# Patient Record
Sex: Female | Born: 1985 | Race: Black or African American | Hispanic: No | Marital: Married | Smoking: Never smoker
Health system: Southern US, Community
[De-identification: ages and names within clinical notes are randomized; demographics above are authoritative.]

## PROBLEM LIST (undated history)

## (undated) DIAGNOSIS — C50919 Malignant neoplasm of unspecified site of unspecified female breast: Secondary | ICD-10-CM

## (undated) DIAGNOSIS — IMO0002 Reserved for concepts with insufficient information to code with codable children: Secondary | ICD-10-CM

## (undated) DIAGNOSIS — M329 Systemic lupus erythematosus, unspecified: Secondary | ICD-10-CM

---

## 2011-04-18 ENCOUNTER — Ambulatory Visit: Payer: Self-pay | Admitting: Family Medicine

## 2018-02-08 ENCOUNTER — Other Ambulatory Visit: Payer: Self-pay

## 2018-02-08 ENCOUNTER — Ambulatory Visit
Admission: EM | Admit: 2018-02-08 | Discharge: 2018-02-08 | Disposition: A | Discharge status: Home / Self Care | Payer: 59 | Attending: Family Medicine | Admitting: Family Medicine

## 2018-02-08 ENCOUNTER — Encounter: Payer: Self-pay | Admitting: Gynecology

## 2018-02-08 DIAGNOSIS — J069 Acute upper respiratory infection, unspecified: Secondary | ICD-10-CM

## 2018-02-08 MED ORDER — HYDROCOD POLST-CPM POLST ER 10-8 MG/5ML PO SUER: 5.0000 mL | Freq: Two times a day (BID) | ORAL | 0 refills | Status: DC

## 2018-02-08 MED ORDER — BENZONATATE 200 MG PO CAPS: ORAL_CAPSULE | ORAL | 0 refills | Status: DC

## 2018-02-08 MED ORDER — FLUTICASONE PROPIONATE 50 MCG/ACT NA SUSP: 2.0000 | Freq: Every day | NASAL | 0 refills | Status: DC

## 2018-02-08 NOTE — ED Provider Notes (Signed)
[facility]    CSN: [id number] Arrival date & time: 02/08/18  1013     History   Chief Complaint Chief Complaint  Patient presents with  . Cough  . Headache    HPI Deborah Orr is a 32 y.o. female.   HPI  32 year old female accompanied by her children and her husband presents with cough and headaches that she has had for 1 week.  She has had no fever or chills.  Of her family members have the same symptoms.  Her husband had it initially and seemed to give everyone else in the family that the symptoms.  He at the present time is improving.     History reviewed. No pertinent past medical history.  There are no active problems to display for this patient.   History reviewed. No pertinent surgical history.  OB History    No data available       Home Medications    Prior to Admission medications   Medication Sig Start Date End Date Taking? Authorizing Provider  benzonatate (TESSALON) 200 MG capsule Take one cap TID PRN cough 02/08/18   Lorin Picket, PA-C  chlorpheniramine-HYDROcodone [facility]) 10-8 MG/5ML SUER Take 5 mLs by mouth 2 (two) times daily. 02/08/18   Lorin Picket, PA-C  fluticasone (FLONASE) 50 MCG/ACT nasal spray Place 2 sprays into both nostrils daily. 02/08/18   Lorin Picket, PA-C    Family History Family History  Problem Relation Age of Onset  . Hypertension Mother     Social History Social History   Tobacco Use  . Smoking status: Never Smoker  . Smokeless tobacco: Never Used  Substance Use Topics  . Alcohol use: No    Frequency: Never  . Drug use: No     Allergies   Patient has no known allergies.   Review of Systems Review of Systems  Constitutional: Positive for activity change. Negative for chills, fatigue and fever.  HENT: Positive for congestion, postnasal drip and rhinorrhea.   Respiratory: Positive for cough.   All other systems reviewed and are negative.    Physical Exam Triage  Vital Signs ED Triage Vitals  Enc Vitals Group     BP 02/08/18 1110 120/80     Pulse Rate 02/08/18 1110 98     Resp 02/08/18 1110 16     Temp 02/08/18 1110 98.3 F (36.8 C)     Temp Source 02/08/18 1110 Oral     SpO2 02/08/18 1110 100 %     Weight 02/08/18 1111 163 lb (73.9 kg)     Height 02/08/18 1111 5\' 5"  (1.651 m)     Head Circumference --      Peak Flow --      Pain Score 02/08/18 1111 7     Pain Loc --      Pain Edu? --      Excl. in [place]? --    No data found.  Updated Vital Signs BP 120/80 (BP Location: Right Arm)   Pulse 98   Temp 98.3 F (36.8 C) (Oral)   Resp 16   Ht 5\' 5"  (1.651 m)   Wt 163 lb (73.9 kg)   LMP 01/18/2018   SpO2 100%   BMI 27.12 kg/m   Visual Acuity Right Eye Distance:   Left Eye Distance:   Bilateral Distance:    Right Eye Near:   Left Eye Near:    Bilateral Near:     Physical Exam  Constitutional:  She is oriented to person, place, and time. She appears well-developed and well-nourished.  Non-toxic appearance. She does not appear ill. No distress.  HENT:  Head: Normocephalic.  Eyes: Pupils are equal, round, and reactive to light.  Neck: Normal range of motion.  Pulmonary/Chest: Effort normal and breath sounds normal.  Musculoskeletal: Normal range of motion.  Neurological: She is alert and oriented to person, place, and time.  Skin: Skin is warm and dry.  Psychiatric: She has a normal mood and affect. Her behavior is normal.  Nursing note and vitals reviewed.    UC Treatments / Results  Labs (all labs ordered are listed, but only abnormal results are displayed) Labs Reviewed - No data to display  EKG  EKG Interpretation None       Radiology No results found.  Procedures Procedures (including critical care time)  Medications Ordered in UC Medications - No data to display   Initial Impression / Assessment and Plan / UC Course  I have reviewed the triage vital signs and the nursing notes.  Pertinent labs &  imaging results that were available during my care of the patient were reviewed by me and considered in my medical decision making (see chart for details).     Plan: 1. Test/x-ray results and diagnosis reviewed with patient 2. rx as per orders; risks, benefits, potential side effects reviewed with patient 3. Recommend supportive treatment with rest and fluids.  Tylenol or Motrin for fever or body aches.  Told her this is likely a virus does not require antibiotics.  Not improving she should follow-up with her primary care physician 4. F/u prn if symptoms worsen or don't improve   Final Clinical Impressions(s) / UC Diagnoses   Final diagnoses:  Upper respiratory tract infection, unspecified type    ED Discharge Orders        Ordered    benzonatate (TESSALON) 200 MG capsule     02/08/18 1233    chlorpheniramine-HYDROcodone (TUSSIONEX PENNKINETIC ER) 10-8 MG/5ML SUER  2 times daily     02/08/18 1233    fluticasone (FLONASE) 50 MCG/ACT nasal spray  Daily     02/08/18 1233       Controlled Substance Prescriptions [place] Controlled Substance Registry consulted? Not Applicable   Lorin Picket, PA-C 02/08/18 1742

## 2018-02-08 NOTE — ED Triage Notes (Signed)
Per patient cough / headaches x 1 week.

## 2019-06-26 ENCOUNTER — Encounter: Payer: Self-pay | Admitting: Emergency Medicine

## 2019-06-26 ENCOUNTER — Other Ambulatory Visit: Payer: Self-pay

## 2019-06-26 ENCOUNTER — Ambulatory Visit (INDEPENDENT_AMBULATORY_CARE_PROVIDER_SITE_OTHER): Payer: 59

## 2019-06-26 ENCOUNTER — Ambulatory Visit
Admission: EM | Admit: 2019-06-26 | Discharge: 2019-06-26 | Disposition: A | Discharge status: Home / Self Care | Payer: 59 | Attending: Emergency Medicine | Admitting: Emergency Medicine

## 2019-06-26 DIAGNOSIS — M25562 Pain in left knee: Secondary | ICD-10-CM | POA: Diagnosis not present

## 2019-06-26 MED ORDER — MELOXICAM 15 MG PO TABS: 15.0000 mg | ORAL_TABLET | Freq: Every day | ORAL | 0 refills | Status: DC | PRN

## 2019-06-26 NOTE — ED Provider Notes (Signed)
[facility] ____________________________________________  Time seen: Approximately 8:50 AM  I have reviewed the triage vital signs and the nursing notes.   HISTORY  Chief Complaint Knee Pain (left)  HPI Deborah Orr is a 33 y.o. female with a history of lupus presenting for evaluation of left knee pain.  Patient reports left knee pain has been present for the last 1 week and has increased in the last few days prompting her to come in.  Denies any known specific injury or trauma.  States that she first noticed it while doing exercises with her daughter, particularly lunges.  Has been biking recently.  Denies any clicking, giving out sensation.  No pain radiation.  States pain is an aching inside.  No fall or direct injury.  Reports otherwise doing well.  Has not taken any over-the-counter medication for same complaints.  Denies alleviating or aggravating factors otherwise.  No recent cough, congestion or fevers.  Patient's last menstrual period was 06/12/2019 (approximate).Denies pregnancy.   past medical history Lupus  There are no active problems to display for this patient.   History reviewed. No pertinent surgical history.   No current facility-administered medications for this encounter.   Current Outpatient Medications:  .  meloxicam (MOBIC) 15 MG tablet, Take 1 tablet (15 mg total) by mouth daily as needed., Disp: 20 tablet, Rfl: 0  Allergies Patient has no known allergies.  Family History  Problem Relation Age of Onset  . Hypertension Mother     Social History Social History   Tobacco Use  . Smoking status: Never Smoker  . Smokeless tobacco: Never Used  Substance Use Topics  . Alcohol use: No    Frequency: Never  . Drug use: No    Review of Systems Constitutional: No fever ENT: No sore throat. Cardiovascular: Denies chest pain. Respiratory: Denies shortness of breath. Gastrointestinal: No abdominal pain. Musculoskeletal: Positive left  knee pain. Skin: Negative for rash.   ____________________________________________   PHYSICAL EXAM:  VITAL SIGNS: ED Triage Vitals  Enc Vitals Group     BP 06/26/19 0834 (!) 135/93     Pulse Rate 06/26/19 0834 84     Resp 06/26/19 0834 14     Temp 06/26/19 0834 97.6 F (36.4 C)     Temp Source 06/26/19 0834 Oral     SpO2 06/26/19 0834 100 %     Weight 06/26/19 0833 170 lb (77.1 kg)     Height 06/26/19 0833 5\' 6"  (1.676 m)     Head Circumference --      Peak Flow --      Pain Score 06/26/19 0833 4     Pain Loc --      Pain Edu? --      Excl. in [place]? --     Constitutional: Alert and oriented. Well appearing and in no acute distress. Eyes: Conjunctivae are normal. ENT      Head: Normocephalic and atraumatic. Cardiovascular: Normal rate, regular rhythm. Grossly normal heart sounds.  Good peripheral circulation. Respiratory: Normal respiratory effort without tachypnea nor retractions. Breath sounds are clear and equal bilaterally. No wheezes, rales, rhonchi. Musculoskeletal:  Bilateral pedal pulses equal and easily palpated.  Steady gait.   Left knee nontender to direct palpation, mild pain with anterior and posterior drawer test, no medial or lateral stress pain, able to fully extend as well as flex, mild pain with full flexion, left lower extremity otherwise nontender.  No edema noted bilaterally. Neurologic:  Normal speech and language.Speech is normal.  No gait instability.  Skin:  Skin is warm, dry and intact. No rash noted. Psychiatric: Mood and affect are normal. Speech and behavior are normal. Patient exhibits appropriate insight and judgment   ___________________________________________   LABS (all labs ordered are listed, but only abnormal results are displayed)  Labs Reviewed - No data to display ____________________________________________  RADIOLOGY  Dg Knee Complete 4 Views Left  Result Date: 06/26/2019 CLINICAL DATA:  Left knee pain. EXAM: LEFT KNEE -  COMPLETE 4+ VIEW COMPARISON:  None. FINDINGS: No evidence of fracture, dislocation, or joint effusion. No evidence of arthropathy or other focal bone abnormality. Soft tissues are unremarkable. IMPRESSION: Negative. Electronically Signed   By: Aletta Edouard M.D.   On: 06/26/2019 09:39   ____________________________________________   PROCEDURES Procedures    INITIAL IMPRESSION / ASSESSMENT AND PLAN / ED COURSE  Pertinent labs & imaging results that were available during my care of the patient were reviewed by me and considered in my medical decision making (see chart for details).  Well-appearing patient.  No acute distress.  Left knee pain for last 1 week.  Suspect sprain injury.  Left knee x-ray negative.  Will treat with oral daily Mobic.  Encourage rest, gradual increase of activity as tolerated. Discussed indication, risks and benefits of medications with patient.  Discussed follow up with Primary care physician this week as needed. Discussed follow up and return parameters including no resolution or any worsening concerns. Patient verbalized understanding and agreed to plan.   ____________________________________________   FINAL CLINICAL IMPRESSION(S) / ED DIAGNOSES  Final diagnoses:  Acute pain of left knee     ED Discharge Orders         Ordered    meloxicam (MOBIC) 15 MG tablet  Daily PRN     06/26/19 0929           Note: This dictation was prepared with Dragon dictation along with smaller phrase technology. Any transcriptional errors that result from this process are unintentional.         Marylene Land, NP 06/26/19 1001

## 2019-06-26 NOTE — ED Triage Notes (Signed)
Patient c/o left knee pain that started a week ago.  Patient denies injury or fall.

## 2019-06-26 NOTE — Discharge Instructions (Signed)
Take medication as prescribed. Rest. Gradually increase activity as tolerated.   Follow up with your primary care physician this week as needed. Return to Urgent care for new or worsening concerns.

## 2019-07-29 ENCOUNTER — Ambulatory Visit (INDEPENDENT_AMBULATORY_CARE_PROVIDER_SITE_OTHER): Payer: 59

## 2019-07-29 ENCOUNTER — Encounter: Payer: Self-pay | Admitting: Emergency Medicine

## 2019-07-29 ENCOUNTER — Ambulatory Visit
Admission: EM | Admit: 2019-07-29 | Discharge: 2019-07-29 | Disposition: A | Discharge status: Home / Self Care | Payer: 59 | Attending: Emergency Medicine | Admitting: Emergency Medicine

## 2019-07-29 ENCOUNTER — Other Ambulatory Visit: Payer: Self-pay

## 2019-07-29 DIAGNOSIS — M546 Pain in thoracic spine: Secondary | ICD-10-CM | POA: Diagnosis not present

## 2019-07-29 DIAGNOSIS — R071 Chest pain on breathing: Secondary | ICD-10-CM | POA: Diagnosis not present

## 2019-07-29 HISTORY — DX: Systemic lupus erythematosus, unspecified: M32.9

## 2019-07-29 HISTORY — DX: Reserved for concepts with insufficient information to code with codable children: IMO0002

## 2019-07-29 LAB — BASIC METABOLIC PANEL
Anion gap: 7 (ref 5–15)
BUN: 14 mg/dL (ref 6–20)
CO2: 24 mmol/L (ref 22–32)
Calcium: 9 mg/dL (ref 8.9–10.3)
Chloride: 105 mmol/L (ref 98–111)
Creatinine, Ser: 0.86 mg/dL (ref 0.44–1.00)
GFR calc Af Amer: >60 mL/min (ref >60)
GFR calc non Af Amer: >60 mL/min (ref >60)
Glucose, Bld: 91 mg/dL (ref 70–99)
Potassium: 3.6 mmol/L (ref 3.5–5.1)
Sodium: 136 mmol/L (ref 135–145)

## 2019-07-29 LAB — FIBRIN DERIVATIVES D-DIMER (ARMC ONLY): Fibrin derivatives D-dimer (ARMC): 609.47 ng/mL (FEU) — ABNORMAL HIGH (ref 0.00–499.00)

## 2019-07-29 MED ORDER — IBUPROFEN 600 MG PO TABS: 600.0000 mg | ORAL_TABLET | Freq: Four times a day (QID) | ORAL | 0 refills | Status: DC | PRN

## 2019-07-29 MED ORDER — TIZANIDINE HCL 4 MG PO TABS: 4.0000 mg | ORAL_TABLET | Freq: Three times a day (TID) | ORAL | 0 refills | Status: DC | PRN

## 2019-07-29 MED ORDER — HYDROCODONE-ACETAMINOPHEN 5-325 MG PO TABS: 1.0000 | ORAL_TABLET | Freq: Four times a day (QID) | ORAL | 0 refills | Status: DC | PRN

## 2019-07-29 NOTE — ED Triage Notes (Signed)
Patient c/o sharp pain in her back yesterday while she was sitting in a chair. She states she still has the pain this morning and is unsure if she pulled a muscle.

## 2019-07-29 NOTE — ED Provider Notes (Signed)
HPI  SUBJECTIVE:  Deborah Orr is a 33 y.o. female who presents with the acute onset of sharp, throbbing, constant left thoracic pain while at rest yesterday.  She states that it goes to the top of her back, and went across her upper back this morning.  No change in physical activity, recent heavy lifting.  No fevers, coughing, wheezing, shortness of breath.  No chest pain.  No calf pain, swelling.  No recent surgery, immobilization, hemoptysis.  No numbness or tingling in her hands.  She denies low back pain.  She has had symptoms like this before about a year ago, cannot remember what it was.  Has been in her usual state of health up until today.  She tried ibuprofen last night without improvement in her symptoms.  Symptoms are better with sitting still, lying down, worse with movement, deep inspiration, torso rotation, leaning forward.  Past medical history of lupus.  No history of chronic kidney disease, hypertension, diabetes, PE, DVT, OCP use, CA,  hypercoagulability, pneumothorax, smoking.  Family history negative for DVT/PE. LMP: 3 weeks ago.  Denies the possibility being pregnant.  PMD: Dr. Ancil Boozer at [facility].   Past Medical History:  Diagnosis Date  . Lupus ([place])     History reviewed. No pertinent surgical history.  Family History  Problem Relation Age of Onset  . Hypertension Mother     Social History   Tobacco Use  . Smoking status: Never Smoker  . Smokeless tobacco: Never Used  Substance Use Topics  . Alcohol use: No    Frequency: Never  . Drug use: No    No current facility-administered medications for this encounter.   Current Outpatient Medications:  .  HYDROcodone-acetaminophen (NORCO/VICODIN) 5-325 MG tablet, Take 1-2 tablets by mouth every 6 (six) hours as needed for moderate pain or severe pain., Disp: 12 tablet, Rfl: 0 .  ibuprofen (ADVIL) 600 MG tablet, Take 1 tablet (600 mg total) by mouth every 6 (six) hours as needed., Disp: 30  tablet, Rfl: 0 .  tiZANidine (ZANAFLEX) 4 MG tablet, Take 1 tablet (4 mg total) by mouth every 8 (eight) hours as needed for muscle spasms., Disp: 30 tablet, Rfl: 0  No Known Allergies   ROS  As noted in HPI.   Physical Exam  BP 127/85 (BP Location: Right Arm)   Pulse 72   Temp 98.2 F (36.8 C) (Oral)   Ht 5\' 6"  (1.676 m)   Wt 78 kg   LMP 07/08/2019   SpO2 98%   BMI 27.76 kg/m   Constitutional: Well developed, well nourished, no acute distress Eyes:  EOMI, conjunctiva normal bilaterally HENT: Normocephalic, atraumatic,mucus membranes moist Respiratory: Normal inspiratory effort, lungs clear bilaterally, Cardiovascular: Normal rate regular rhythm, no murmurs rubs or gallops GI: nondistended.  skin: No rash, skin intact Musculoskeletal: No C, T, L-spine tenderness.  Positive trapezial tenderness left side.  Patient states this does not reproduce her pain.  No tenderness over the rhomboid, latissimus, thoracic region where she states the pain is located.  No appreciable muscle spasm.  No tenderness right back.  No paralumbar tenderness.  Pain aggravated with deep inspiration, torso rotation Calves symmetric, nontender, no edema Neurologic: Alert & oriented x 3, no focal neuro deficits.  . Psychiatric: Speech and behavior appropriate   ED Course   Medications - No data to display  Orders Placed This Encounter  Procedures  . DG Chest 2 View    Standing Status:   Standing  Number of Occurrences:   1    Order Specific Question:   Reason for Exam (SYMPTOM  OR DIAGNOSIS REQUIRED)    Answer:   L sided thoracic pain r/o PTX effusion. H/o lupus  . Fibrin derivatives D-Dimer    Standing Status:   Standing    Number of Occurrences:   1  . Basic metabolic panel    Standing Status:   Standing    Number of Occurrences:   1    Results for orders placed or performed during the hospital encounter of 07/29/19 (from the past 24 hour(s))  Fibrin derivatives D-Dimer     Status:  Abnormal   Collection Time: 07/29/19  9:24 AM  Result Value Ref Range   Fibrin derivatives D-dimer ([facility]) 609.47 (H) 0.00 - 499.00 ng/mL (FEU)  Basic metabolic panel     Status: None   Collection Time: 07/29/19  9:24 AM  Result Value Ref Range   Sodium 136 135 - 145 mmol/L   Potassium 3.6 3.5 - 5.1 mmol/L   Chloride 105 98 - 111 mmol/L   CO2 24 22 - 32 mmol/L   Glucose, Bld 91 70 - 99 mg/dL   BUN 14 6 - 20 mg/dL   Creatinine, Ser 0.86 0.44 - 1.00 mg/dL   Calcium 9.0 8.9 - 10.3 mg/dL   GFR calc non Af Amer >60 >60 mL/min   GFR calc Af Amer >60 >60 mL/min   Anion gap 7 5 - 15   Dg Chest 2 View  Result Date: 07/29/2019 CLINICAL DATA:  Pain started across upper left post back yesterday while she was on a phone call. States its hurts to breathe deep or move certain ways. This am pain was going across to post right upper side as well. H/o lupus EXAM: CHEST - 2 VIEW COMPARISON:  None. FINDINGS: The heart size and mediastinal contours are within normal limits. Both lungs are clear. No pneumothorax or pleural effusion. The visualized skeletal structures are unremarkable. IMPRESSION: Normal chest radiograph. Electronically Signed   By: Audie Pinto M.D.   On: 07/29/2019 09:37    ED Clinical Impression  1. Acute left-sided thoracic back pain     ED Assessment/Plan  Review [facility] narcotic database.  No opiate prescriptions since 3/19.  Feel that it is safe to proceed with a controlled substance prescription.  Vitals are normal.  Satting well on room air.  Patient meets PERC criteria. Will get a chest x-ray rule out pneumothorax, pleural effusion.  Doubt pneumonia.  However because lupus is a hypercoagulable state, will check a d-dimer to rule out PE.  Getting BMP for kidney function.  Discussed getting this labs with patient versus treating as if this is musculoskeletal pain.  Discussed with her that if it is elevated she will need to go to the ER for a CT of the chest.  She wishes  to proceed with testing.  Patient declined pain medication.  Reviewed  imaging independently.  No effusion, pneumothorax as read by me.  See radiology report for full details.  We will treat this as if this is musculoskeletal pain for now with ibuprofen and a Tylenol containing product together 3-4 times a day, ibuprofen/1 g of Tylenol for mild to moderate pain, ibuprofen/1-2 Norco for severe pain, Zanaflex.  Will call patient with dimer and BMP Results.  Number [phone].  D-dimer positive. discussed with patient.  Advised her to go to the emergency department to rule out PE. She agrees to go.  discussed imaging, medical decision-making, and plan for follow-up with the patient.  Discussed signs and symptoms that should prompt return to the emergency department.  Patient agrees with plan.  Meds ordered this encounter  Medications  . HYDROcodone-acetaminophen (NORCO/VICODIN) 5-325 MG tablet    Sig: Take 1-2 tablets by mouth every 6 (six) hours as needed for moderate pain or severe pain.    Dispense:  12 tablet    Refill:  0  . ibuprofen (ADVIL) 600 MG tablet    Sig: Take 1 tablet (600 mg total) by mouth every 6 (six) hours as needed.    Dispense:  30 tablet    Refill:  0  . tiZANidine (ZANAFLEX) 4 MG tablet    Sig: Take 1 tablet (4 mg total) by mouth every 8 (eight) hours as needed for muscle spasms.    Dispense:  30 tablet    Refill:  0    *This clinic note was created using [employer]. Therefore, there may be occasional mistakes despite careful proofreading.  ?     Melynda Ripple, MD 07/29/19 1108

## 2019-07-29 NOTE — Discharge Instructions (Addendum)
We are treating this as if this is musculoskeletal pain for now with ibuprofen and a Tylenol containing product together 3-4 times a day, ibuprofen/1 g of Tylenol for mild to moderate pain, ibuprofen/1-2 Norco for severe pain, Zanaflex for muscle spasms.  I will call you with dimer and BMP results.  If you do not hear from me within several hours, feel free to call here.  If your d-dimer is elevated, you will need to go to the emergency department for a CT of the chest to rule out PE.  Go immediately to the ER if you have pain that is not controlled with the ibuprofen/Norco, if you get worse, or for any concerns.  Otherwise, follow-up with your doctor in several days.

## 2020-06-08 ENCOUNTER — Ambulatory Visit: Payer: Self-pay

## 2020-07-21 IMAGING — CR LEFT KNEE - COMPLETE 4+ VIEW
4 series · 4 of 4 positions shown · non-contrast
Comparison: None.

CLINICAL DATA: Left knee pain.

EXAM:
LEFT KNEE - COMPLETE 4+ VIEW

[knee ap]
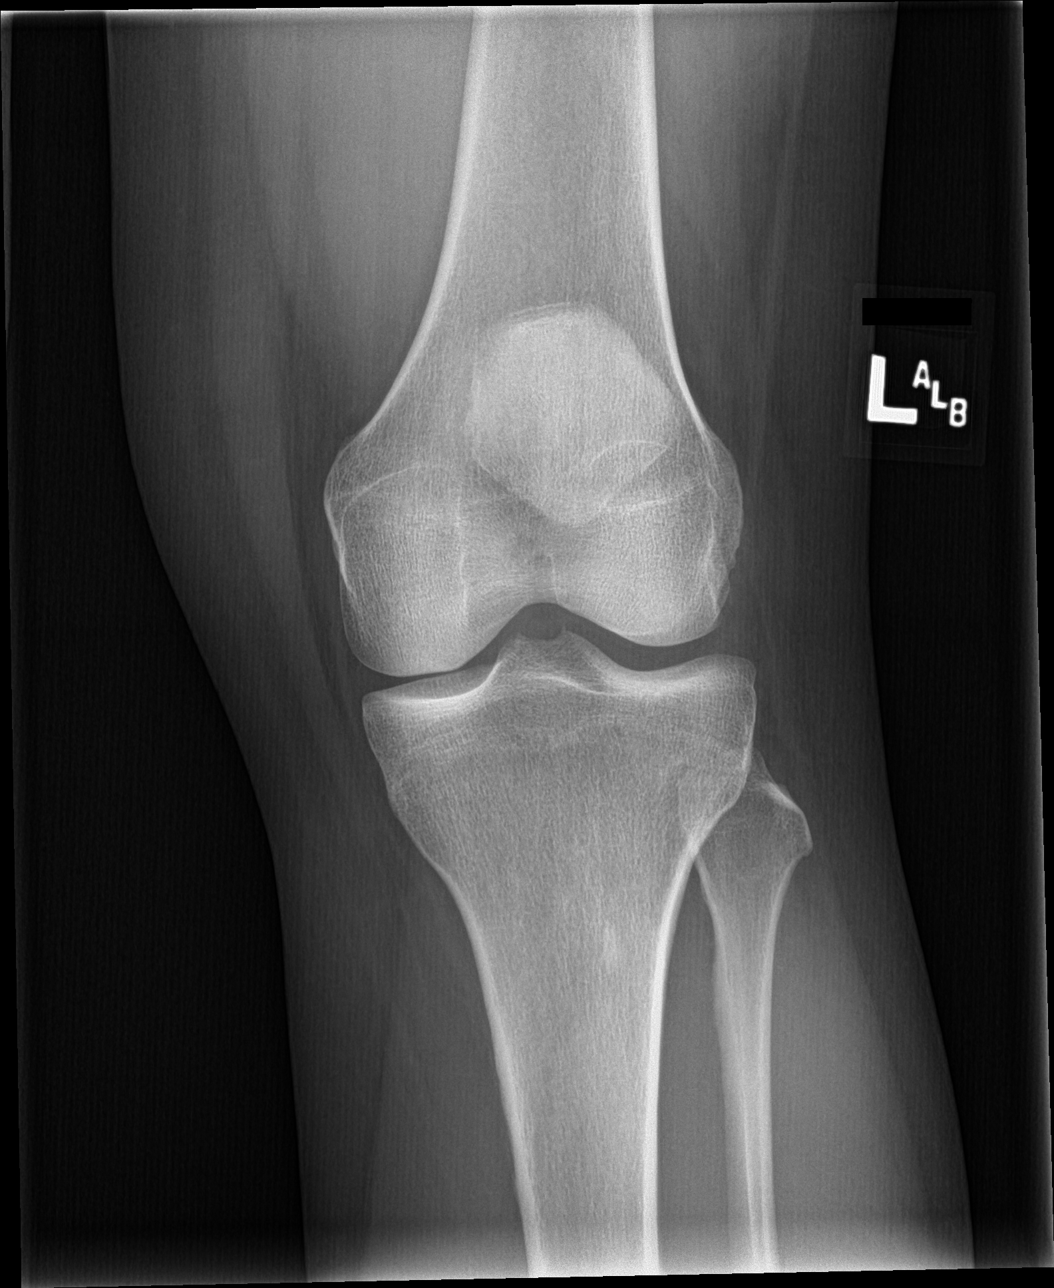

[knee lat]
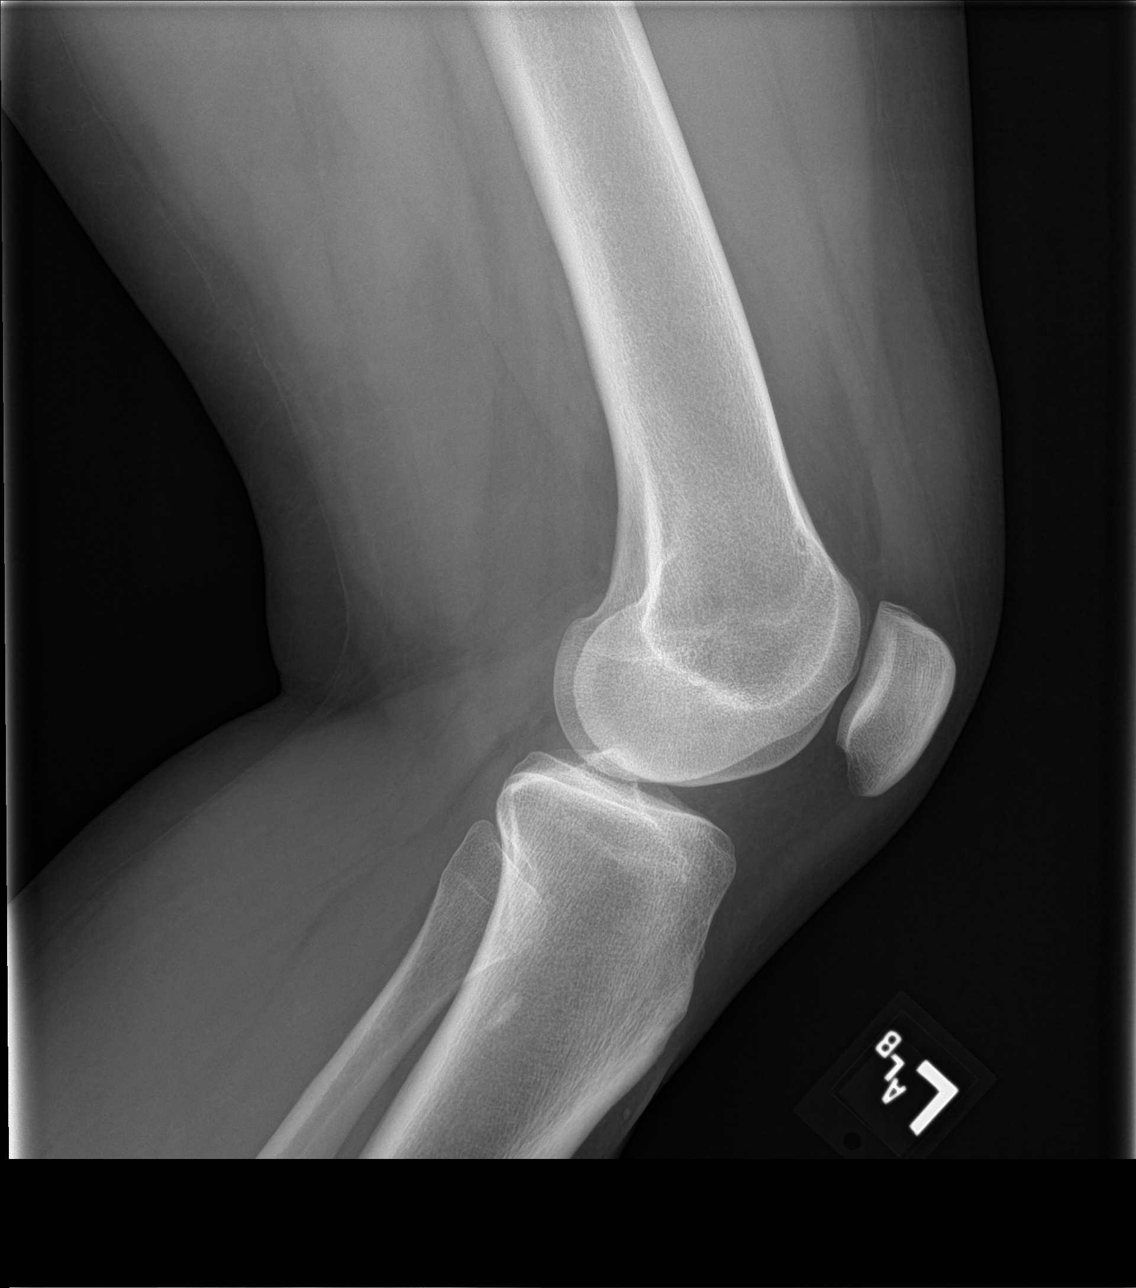

[tunnel]
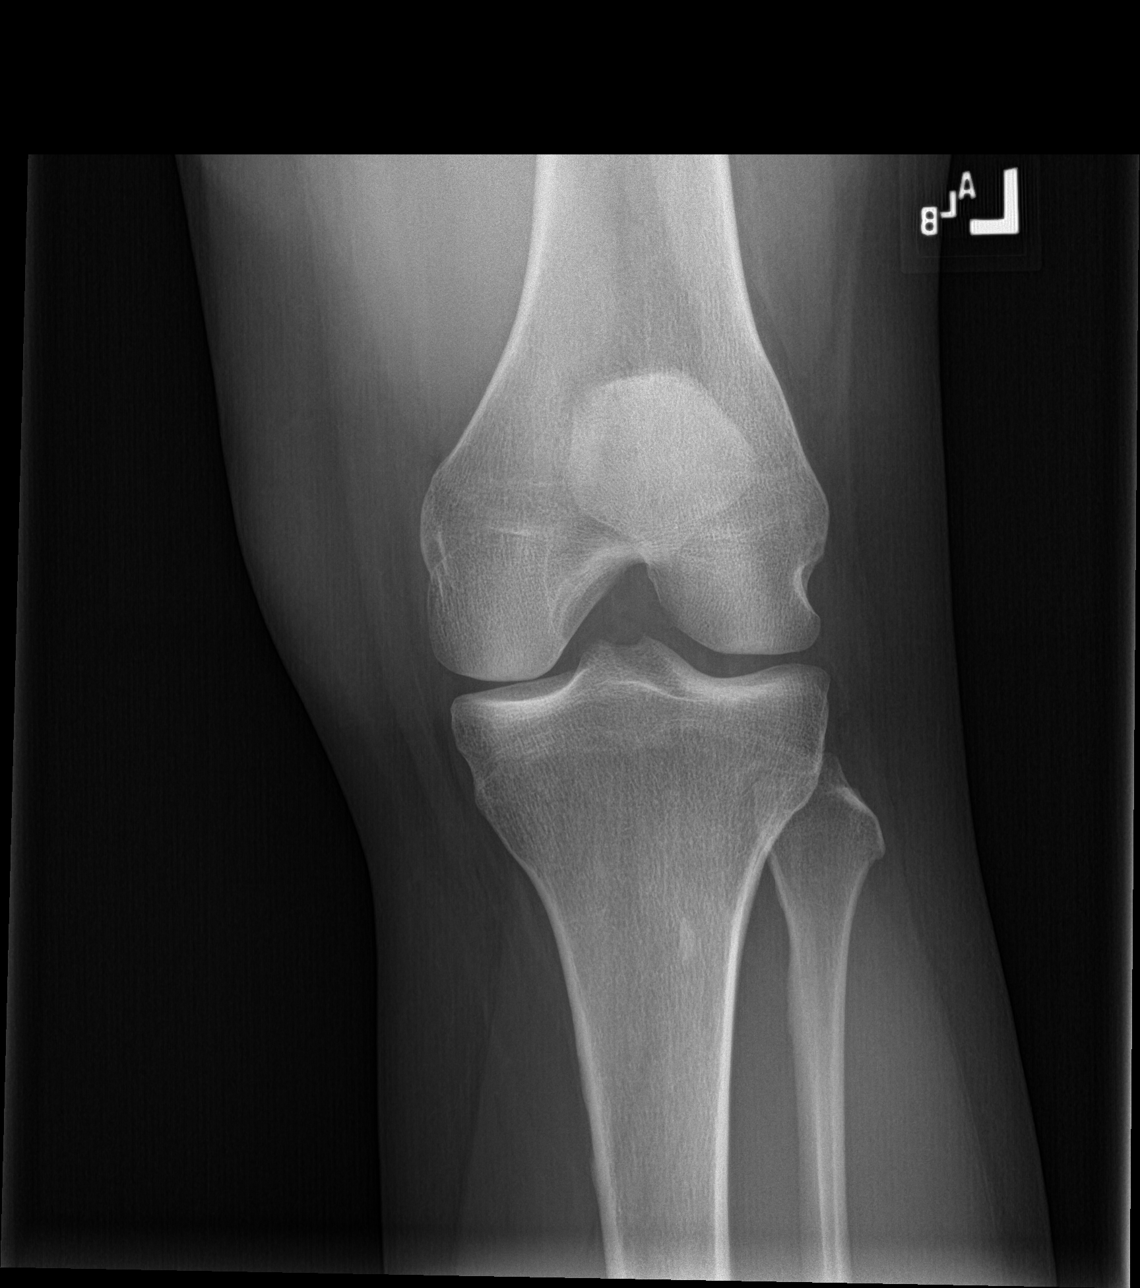

[patella skyline]
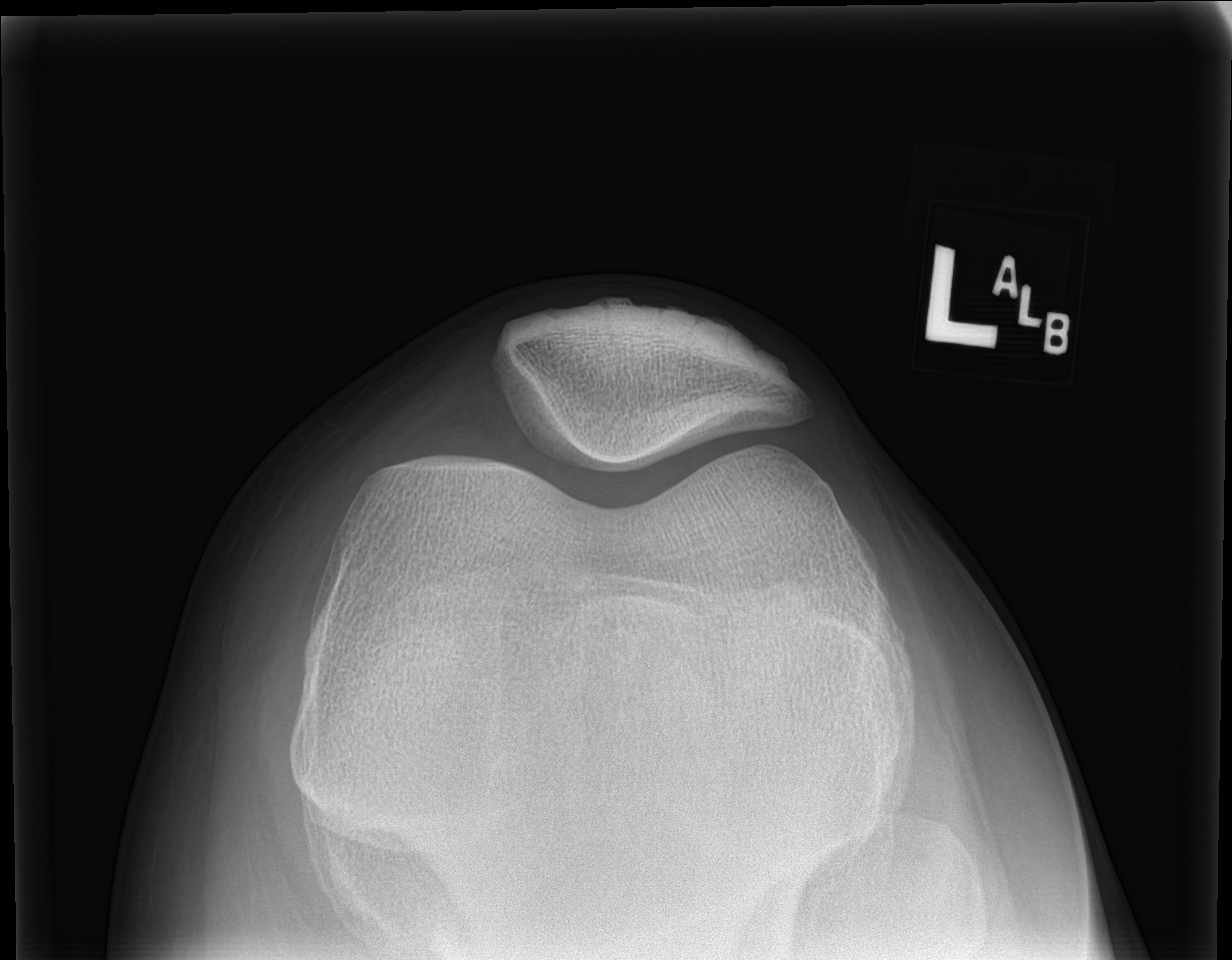

[4 of 4 positions shown; findings below may reference images not displayed]

FINDINGS: No evidence of fracture, dislocation, or joint effusion. No evidence
of arthropathy or other focal bone abnormality. Soft tissues are
unremarkable.
IMPRESSION: Negative.

## 2020-08-23 IMAGING — CR CHEST - 2 VIEW
2 series · 2 of 2 positions shown · non-contrast
Comparison: None.

CLINICAL DATA: Pain started across upper left post back yesterday
while she was on a phone call. States its hurts to breathe deep or
move certain ways. This am pain was going across to post right upper
side as well. H/o lupus

EXAM:
CHEST - 2 VIEW

[chest pa]
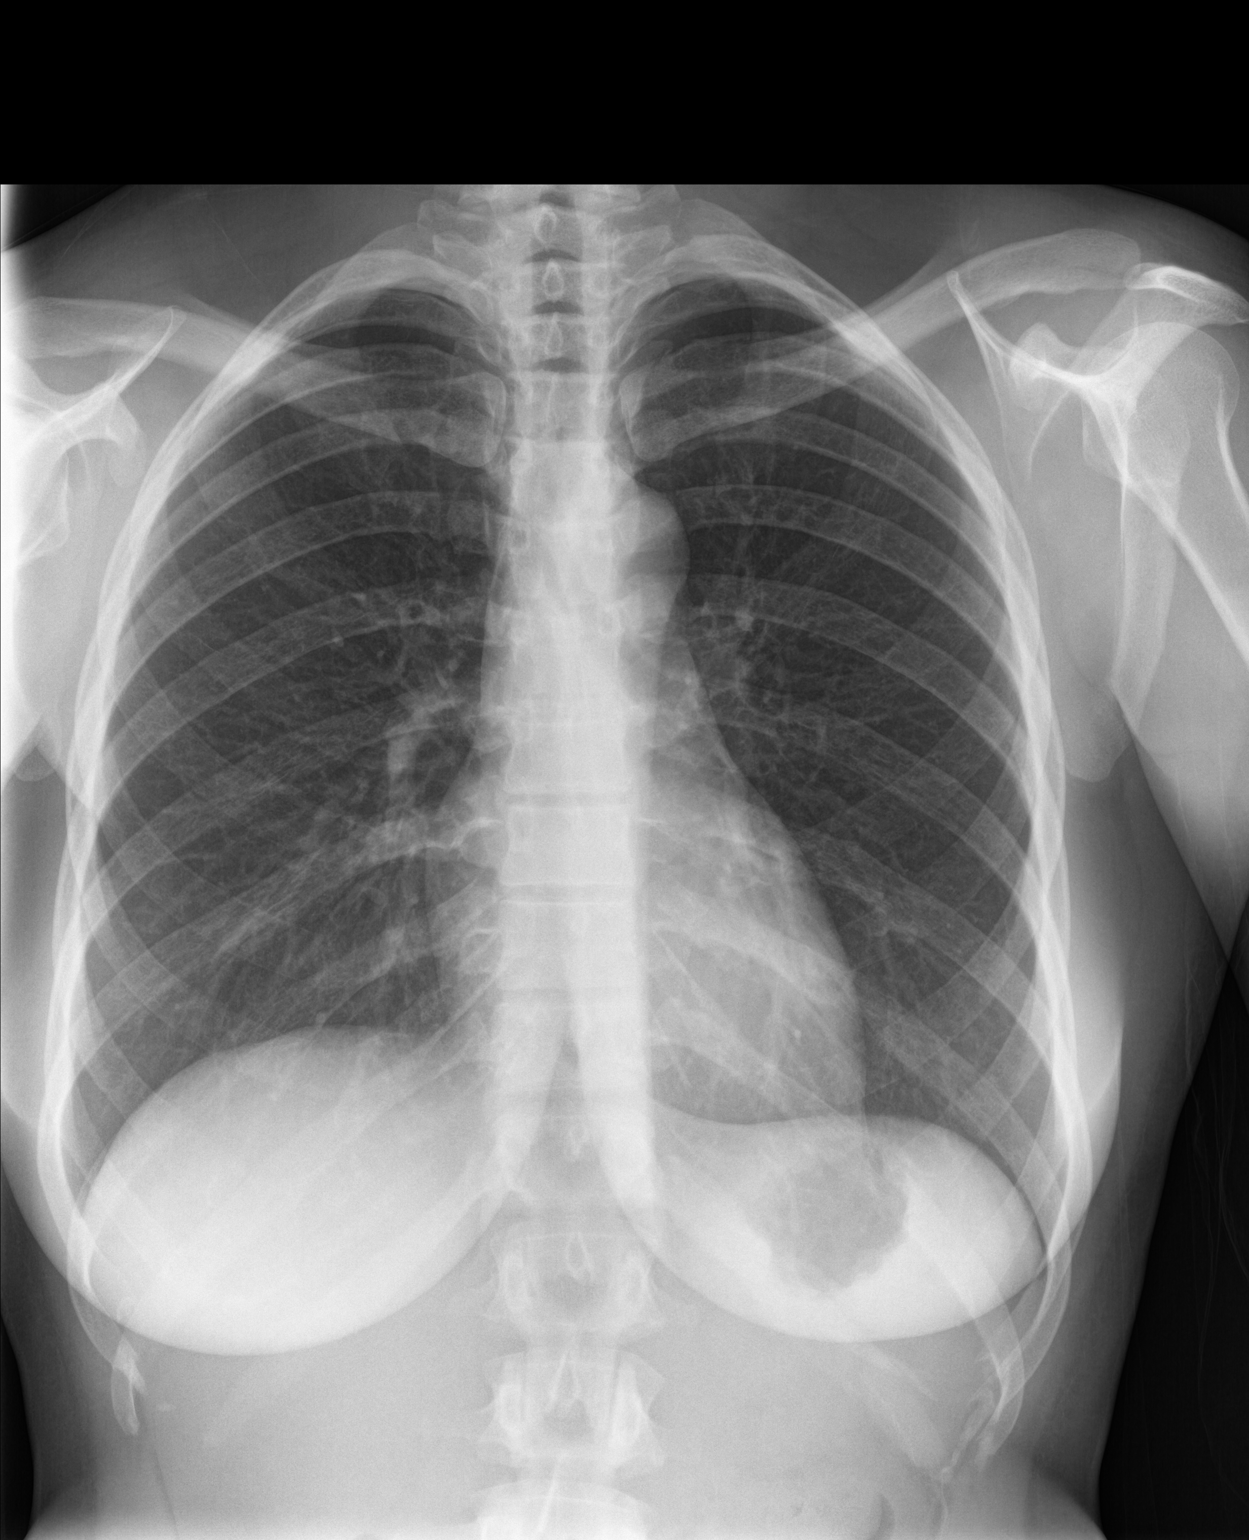

[chest lat]
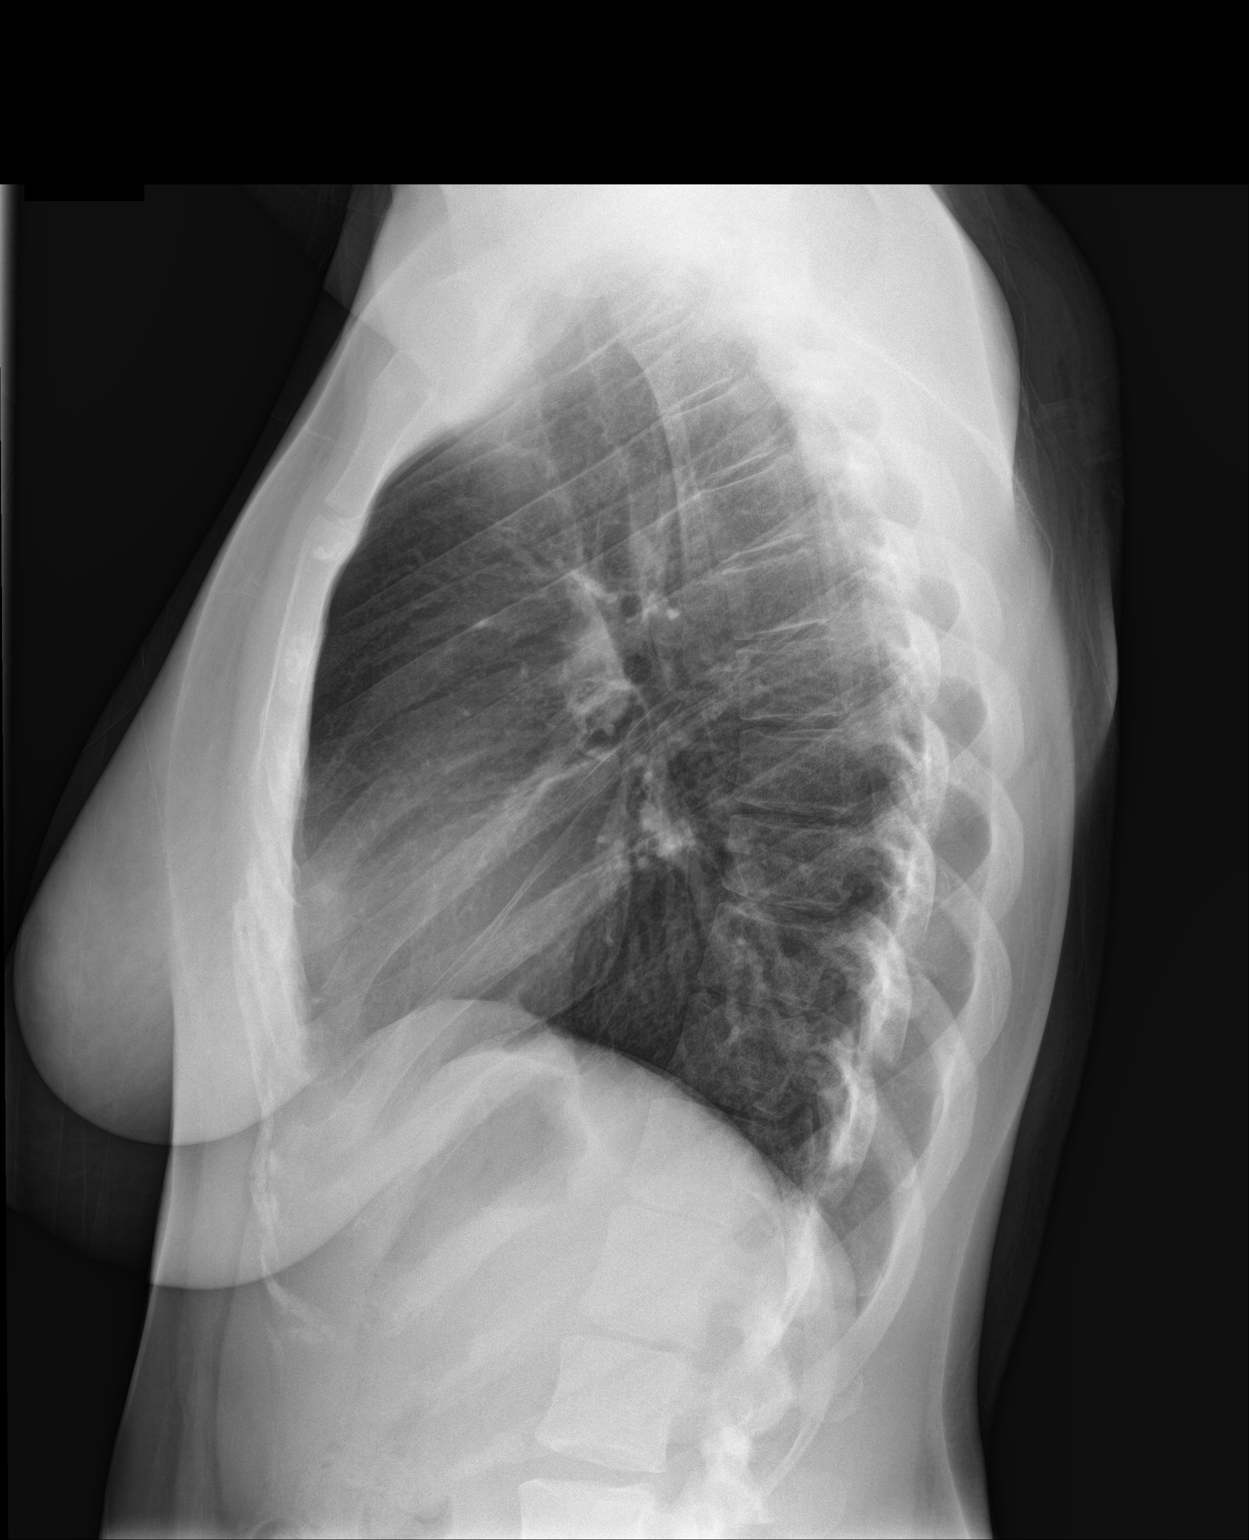

[2 of 2 positions shown; findings below may reference images not displayed]

FINDINGS: The heart size and mediastinal contours are within normal limits.
Both lungs are clear. No pneumothorax or pleural effusion. The
visualized skeletal structures are unremarkable.
IMPRESSION: Normal chest radiograph.

## 2020-09-05 ENCOUNTER — Ambulatory Visit
Admission: RE | Admit: 2020-09-05 | Discharge: 2020-09-05 | Disposition: A | Discharge status: Home / Self Care | Payer: 59 | Source: Ambulatory Visit | Attending: Emergency Medicine | Admitting: Emergency Medicine

## 2020-09-05 ENCOUNTER — Other Ambulatory Visit: Payer: Self-pay

## 2020-09-05 VITALS — BP 133/93 | HR 85 | Temp 98.6°F | Resp 16 | Ht 67.0 in | Wt 170.0 lb

## 2020-09-05 DIAGNOSIS — Z20822 Contact with and (suspected) exposure to covid-19: Secondary | ICD-10-CM | POA: Diagnosis not present

## 2020-09-05 DIAGNOSIS — J40 Bronchitis, not specified as acute or chronic: Secondary | ICD-10-CM

## 2020-09-05 DIAGNOSIS — R059 Cough, unspecified: Secondary | ICD-10-CM | POA: Diagnosis present

## 2020-09-05 LAB — SARS CORONAVIRUS 2 (TAT 6-24 HRS): SARS Coronavirus 2: NEGATIVE

## 2020-09-05 MED ORDER — GUAIFENESIN-CODEINE 100-10 MG/5ML PO SOLN: 10.0000 mL | Freq: Four times a day (QID) | ORAL | 0 refills | Status: AC | PRN

## 2020-09-05 MED ORDER — BENZONATATE 100 MG PO CAPS: 200.0000 mg | ORAL_CAPSULE | Freq: Three times a day (TID) | ORAL | 0 refills | Status: AC | PRN

## 2020-09-05 NOTE — ED Triage Notes (Signed)
Patient in today w/ c/o cough x 2 weeks. Patient states when sx first appeared she had some chest pain which has disappeared at this time. Patient denies any other sxs.   Patient states she is vaccinated against COVID-19.

## 2020-09-05 NOTE — Discharge Instructions (Signed)
Rest and keep up your fluid intake to keep your mucous thin.  Use the Tessalon perles every 8 hours during the day and the Cheratussin at bedtime for cough and sleep.  Avoid dairy products as they will make your mucous thicker.  Return for new or worsening symptoms.

## 2020-09-05 NOTE — ED Provider Notes (Signed)
[facility]    CSN: [id number] Arrival date & time: 09/05/20  1048      History   Chief Complaint Chief Complaint  Patient presents with  . Cough    HPI Deborah Orr is a 34 y.o. female.   34 yo female who is here for evaluation of a cough.  She reports that she has had a cough for 2 weeks, The cough is mostly dry but she does occasionally bring up phlegm. She did have some chest pain with coughing the first week but that resolved with a single dose of Nyquil.  She denies SOB, URI, ST, GI/GU symptoims.      Past Medical History:  Diagnosis Date  . Lupus ([place])     There are no problems to display for this patient.   History reviewed. No pertinent surgical history.  OB History   No obstetric history on file.      Home Medications    Prior to Admission medications   Medication Sig Start Date End Date Taking? Authorizing Provider  benzonatate (TESSALON) 100 MG capsule Take 2 capsules (200 mg total) by mouth 3 (three) times daily as needed for cough. 09/05/20   Margarette Canada, NP  guaiFENesin-codeine 100-10 MG/5ML syrup Take 10 mLs by mouth every 6 (six) hours as needed for up to 5 days for cough. 09/05/20 09/10/20  Margarette Canada, NP  fluticasone (FLONASE) 50 MCG/ACT nasal spray Place 2 sprays into both nostrils daily. 02/08/18 06/26/19  Lorin Picket, PA-C    Family History Family History  Problem Relation Age of Onset  . Hypertension Mother     Social History Social History   Tobacco Use  . Smoking status: Never Smoker  . Smokeless tobacco: Never Used  Vaping Use  . Vaping Use: Never used  Substance Use Topics  . Alcohol use: No  . Drug use: No     Allergies   Patient has no known allergies.   Review of Systems Review of Systems  Constitutional: Negative for activity change, appetite change, chills and fever.  HENT: Negative for congestion, rhinorrhea, sinus pressure, sinus pain and sore throat.   Respiratory: Positive for cough.  Negative for shortness of breath and wheezing.   Cardiovascular: Negative for chest pain.  Gastrointestinal: Negative for abdominal pain, diarrhea, nausea and vomiting.  Genitourinary: Negative for difficulty urinating, dysuria and frequency.  Musculoskeletal: Negative for arthralgias and myalgias.  Skin: Negative.   Neurological: Negative for dizziness and syncope.  Hematological: Negative.   Psychiatric/Behavioral: Negative.      Physical Exam Triage Vital Signs ED Triage Vitals  Enc Vitals Group     BP 09/05/20 1125 (!) 133/93     Pulse Rate 09/05/20 1125 85     Resp 09/05/20 1125 16     Temp 09/05/20 1125 98.6 F (37 C)     Temp Source 09/05/20 1125 Oral     SpO2 09/05/20 1125 100 %     Weight 09/05/20 1127 170 lb (77.1 kg)     Height 09/05/20 1127 5\' 7"  (1.702 m)     Head Circumference --      Peak Flow --      Pain Score 09/05/20 1127 0     Pain Loc --      Pain Edu? --      Excl. in [place]? --    No data found.  Updated Vital Signs BP (!) 133/93 (BP Location: Left Arm)   Pulse 85   Temp 98.6  F (37 C) (Oral)   Resp 16   Ht 5\' 7"  (1.702 m)   Wt 170 lb (77.1 kg)   LMP 09/01/2020   SpO2 100%   BMI 26.63 kg/m   Visual Acuity Right Eye Distance:   Left Eye Distance:   Bilateral Distance:    Right Eye Near:   Left Eye Near:    Bilateral Near:     Physical Exam Vitals and nursing note reviewed.  Constitutional:      General: She is not in acute distress.    Appearance: Normal appearance. She is not toxic-appearing.  HENT:     Head: Normocephalic and atraumatic.     Right Ear: Tympanic membrane, ear canal and external ear normal.     Left Ear: Tympanic membrane, ear canal and external ear normal.     Nose: Nose normal. No congestion or rhinorrhea.     Mouth/Throat:     Mouth: Mucous membranes are moist.     Pharynx: Oropharynx is clear. No posterior oropharyngeal erythema.  Eyes:     Extraocular Movements: Extraocular movements intact.      Conjunctiva/sclera: Conjunctivae normal.     Pupils: Pupils are equal, round, and reactive to light.  Cardiovascular:     Rate and Rhythm: Normal rate and regular rhythm.     Pulses: Normal pulses.     Heart sounds: Normal heart sounds. No murmur heard.  No gallop.   Pulmonary:     Effort: Pulmonary effort is normal.     Breath sounds: Normal breath sounds. No wheezing, rhonchi or rales.  Musculoskeletal:        General: Normal range of motion.     Cervical back: Normal range of motion and neck supple.  Lymphadenopathy:     Cervical: No cervical adenopathy.  Skin:    General: Skin is warm.     Capillary Refill: Capillary refill takes less than 2 seconds.  Neurological:     General: No focal deficit present.     Mental Status: She is alert and oriented to person, place, and time.  Psychiatric:        Mood and Affect: Mood normal.        Behavior: Behavior normal.        Thought Content: Thought content normal.        Judgment: Judgment normal.      UC Treatments / Results  Labs (all labs ordered are listed, but only abnormal results are displayed) Labs Reviewed  SARS CORONAVIRUS 2 (TAT 6-24 HRS)    EKG   Radiology No results found.  Procedures Procedures (including critical care time)  Medications Ordered in UC Medications - No data to display  Initial Impression / Assessment and Plan / UC Course  I have reviewed the triage vital signs and the nursing notes.  Pertinent labs & imaging results that were available during my care of the patient were reviewed by me and considered in my medical decision making (see chart for details).   Patient is here for evaluation of a cough. She reports that it has been largely non-productive and she has not had SOB, fever, or wheezing. There is no evidence of upper respiratory tree inflammation or involvement on exam. Lungs are clear to auscultation. Will defer X-ray at this time as I do not feel it would yield valuable data and  the patient does not need the radiation.  Will D/c home with tessalon and cheratussin for bronchitis.    Final Clinical Impressions(s) /  UC Diagnoses   Final diagnoses:  Bronchitis     Discharge Instructions     Rest and keep up your fluid intake to keep your mucous thin.  Use the Tessalon perles every 8 hours during the day and the Cheratussin at bedtime for cough and sleep.  Avoid dairy products as they will make your mucous thicker.  Return for new or worsening symptoms.     ED Prescriptions    Medication Sig Dispense Auth. Provider   benzonatate (TESSALON) 100 MG capsule Take 2 capsules (200 mg total) by mouth 3 (three) times daily as needed for cough. 21 capsule Margarette Canada, NP   guaiFENesin-codeine 100-10 MG/5ML syrup Take 10 mLs by mouth every 6 (six) hours as needed for up to 5 days for cough. 120 mL Margarette Canada, NP     PDMP not reviewed this encounter.   Margarette Canada, NP 09/05/20 1221

## 2021-05-24 ENCOUNTER — Other Ambulatory Visit: Payer: Self-pay | Admitting: Family Medicine

## 2021-05-24 DIAGNOSIS — N644 Mastodynia: Secondary | ICD-10-CM

## 2021-05-24 DIAGNOSIS — Z8742 Personal history of other diseases of the female genital tract: Secondary | ICD-10-CM

## 2021-05-24 DIAGNOSIS — R102 Pelvic and perineal pain: Secondary | ICD-10-CM

## 2021-06-08 ENCOUNTER — Ambulatory Visit
Admission: RE | Admit: 2021-06-08 | Discharge: 2021-06-08 | Disposition: A | Discharge status: Home / Self Care | Payer: 59 | Source: Ambulatory Visit | Attending: Family Medicine | Admitting: Family Medicine

## 2021-06-08 DIAGNOSIS — R102 Pelvic and perineal pain: Secondary | ICD-10-CM

## 2021-06-08 DIAGNOSIS — Z8742 Personal history of other diseases of the female genital tract: Secondary | ICD-10-CM

## 2021-07-05 ENCOUNTER — Ambulatory Visit
Admission: RE | Admit: 2021-07-05 | Discharge: 2021-07-05 | Disposition: A | Discharge status: Home / Self Care | Payer: 59 | Source: Ambulatory Visit | Attending: Family Medicine | Admitting: Family Medicine

## 2021-07-05 ENCOUNTER — Other Ambulatory Visit: Payer: Self-pay

## 2021-07-05 ENCOUNTER — Ambulatory Visit: Payer: 59

## 2021-07-05 DIAGNOSIS — N644 Mastodynia: Secondary | ICD-10-CM

## 2021-07-05 HISTORY — DX: Malignant neoplasm of unspecified site of unspecified female breast: C50.919

## 2022-05-01 ENCOUNTER — Ambulatory Visit (LOCAL_COMMUNITY_HEALTH_CENTER): Payer: Self-pay

## 2022-05-01 DIAGNOSIS — Z111 Encounter for screening for respiratory tuberculosis: Secondary | ICD-10-CM

## 2022-05-04 ENCOUNTER — Ambulatory Visit (LOCAL_COMMUNITY_HEALTH_CENTER): Payer: Self-pay

## 2022-05-04 DIAGNOSIS — Z111 Encounter for screening for respiratory tuberculosis: Secondary | ICD-10-CM

## 2022-05-04 LAB — TB SKIN TEST
Induration: 0 mm
TB Skin Test: NEGATIVE

## 2024-08-31 ENCOUNTER — Other Ambulatory Visit: Payer: Self-pay

## 2024-08-31 ENCOUNTER — Emergency Department

## 2024-08-31 ENCOUNTER — Emergency Department
Admission: EM | Admit: 2024-08-31 | Discharge: 2024-08-31 | Disposition: A | Discharge status: Home / Self Care | Attending: Emergency Medicine | Admitting: Emergency Medicine

## 2024-08-31 DIAGNOSIS — S39012A Strain of muscle, fascia and tendon of lower back, initial encounter: Secondary | ICD-10-CM | POA: Diagnosis not present

## 2024-08-31 DIAGNOSIS — S161XXA Strain of muscle, fascia and tendon at neck level, initial encounter: Secondary | ICD-10-CM | POA: Insufficient documentation

## 2024-08-31 DIAGNOSIS — Y92410 Unspecified street and highway as the place of occurrence of the external cause: Secondary | ICD-10-CM | POA: Insufficient documentation

## 2024-08-31 DIAGNOSIS — M546 Pain in thoracic spine: Secondary | ICD-10-CM | POA: Diagnosis not present

## 2024-08-31 DIAGNOSIS — S199XXA Unspecified injury of neck, initial encounter: Secondary | ICD-10-CM | POA: Diagnosis present

## 2024-08-31 MED ORDER — LIDOCAINE 5 % EX PTCH
1.0000 | MEDICATED_PATCH | CUTANEOUS | Status: DC
  Administered 2024-08-31: 1 via TRANSDERMAL
  Filled 2024-08-31: qty 1

## 2024-08-31 MED ORDER — CYCLOBENZAPRINE HCL 10 MG PO TABS
10.0000 mg | ORAL_TABLET | Freq: Three times a day (TID) | ORAL | 0 refills | Status: AC | PRN
Start: 2024-08-31 — End: 2024-09-04

## 2024-08-31 MED ORDER — KETOROLAC TROMETHAMINE 30 MG/ML IJ SOLN
30.0000 mg | Freq: Once | INTRAMUSCULAR | Status: AC
  Administered 2024-08-31: 30 mg via INTRAMUSCULAR
  Filled 2024-08-31: qty 1

## 2024-08-31 MED ORDER — CYCLOBENZAPRINE HCL 10 MG PO TABS
10.0000 mg | ORAL_TABLET | Freq: Once | ORAL | Status: AC
  Administered 2024-08-31: 10 mg via ORAL
  Filled 2024-08-31: qty 1

## 2024-08-31 MED ORDER — NAPROXEN 500 MG PO TBEC: 500.0000 mg | DELAYED_RELEASE_TABLET | Freq: Two times a day (BID) | ORAL | 0 refills | Status: AC

## 2024-08-31 MED ORDER — LIDOCAINE 5 % EX PTCH: 1.0000 | MEDICATED_PATCH | CUTANEOUS | 0 refills | Status: AC
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
